# Patient Record
Sex: Male | Born: 1980 | Race: White | Hispanic: No | Marital: Single | Smoking: Current every day smoker
Health system: Southern US, Community
[De-identification: ages and names within clinical notes are randomized; demographics above are authoritative.]

---

## 2006-03-16 ENCOUNTER — Emergency Department (HOSPITAL_COMMUNITY): Admission: EM | Admit: 2006-03-16 | Discharge: 2006-03-16 | Payer: Self-pay | Admitting: Emergency Medicine

## 2014-05-24 ENCOUNTER — Emergency Department (HOSPITAL_COMMUNITY)
Admission: EM | Admit: 2014-05-24 | Discharge: 2014-05-24 | Disposition: A | Discharge status: Home / Self Care | Payer: Self-pay | Attending: Emergency Medicine | Admitting: Emergency Medicine

## 2014-05-24 ENCOUNTER — Encounter (HOSPITAL_COMMUNITY): Payer: Self-pay | Admitting: Emergency Medicine

## 2014-05-24 ENCOUNTER — Emergency Department (HOSPITAL_COMMUNITY): Payer: Self-pay

## 2014-05-24 DIAGNOSIS — Z720 Tobacco use: Secondary | ICD-10-CM | POA: Insufficient documentation

## 2014-05-24 DIAGNOSIS — N201 Calculus of ureter: Secondary | ICD-10-CM | POA: Insufficient documentation

## 2014-05-24 DIAGNOSIS — R109 Unspecified abdominal pain: Secondary | ICD-10-CM

## 2014-05-24 LAB — CBC WITH DIFFERENTIAL/PLATELET
BASOS ABS: 0.1 10*3/uL (ref 0.0–0.1)
Basophils Relative: 1 % (ref 0–1)
EOS PCT: 4 % (ref 0–5)
Eosinophils Absolute: 0.4 10*3/uL (ref 0.0–0.7)
HCT: 44.2 % (ref 39.0–52.0)
HEMOGLOBIN: 15.3 g/dL (ref 13.0–17.0)
LYMPHS ABS: 3 10*3/uL (ref 0.7–4.0)
Lymphocytes Relative: 31 % (ref 12–46)
MCH: 31.7 pg (ref 26.0–34.0)
MCHC: 34.6 g/dL (ref 30.0–36.0)
MCV: 91.7 fL (ref 78.0–100.0)
MONO ABS: 0.7 10*3/uL (ref 0.1–1.0)
Monocytes Relative: 7 % (ref 3–12)
Neutro Abs: 5.5 10*3/uL (ref 1.7–7.7)
Neutrophils Relative %: 57 % (ref 43–77)
Platelets: 213 10*3/uL (ref 150–400)
RBC: 4.82 MIL/uL (ref 4.22–5.81)
RDW: 12.2 % (ref 11.5–15.5)
WBC: 9.5 10*3/uL (ref 4.0–10.5)

## 2014-05-24 LAB — URINE MICROSCOPIC-ADD ON

## 2014-05-24 LAB — BASIC METABOLIC PANEL
ANION GAP: 14 (ref 5–15)
BUN: 19 mg/dL (ref 6–23)
CALCIUM: 9.3 mg/dL (ref 8.4–10.5)
CHLORIDE: 100 meq/L (ref 96–112)
CO2: 25 meq/L (ref 19–32)
CREATININE: 1.42 mg/dL — AB (ref 0.50–1.35)
GFR calc Af Amer: 74 mL/min — ABNORMAL LOW (ref >90)
GFR calc non Af Amer: 64 mL/min — ABNORMAL LOW (ref >90)
GLUCOSE: 101 mg/dL — AB (ref 70–99)
Potassium: 4.4 mEq/L (ref 3.7–5.3)
Sodium: 139 mEq/L (ref 137–147)

## 2014-05-24 LAB — URINALYSIS, ROUTINE W REFLEX MICROSCOPIC
Bilirubin Urine: NEGATIVE
Glucose, UA: NEGATIVE mg/dL
Ketones, ur: NEGATIVE mg/dL
Nitrite: NEGATIVE
PH: 6 (ref 5.0–8.0)
PROTEIN: NEGATIVE mg/dL
Specific Gravity, Urine: 1.012 (ref 1.005–1.030)
Urobilinogen, UA: 0.2 mg/dL (ref 0.0–1.0)

## 2014-05-24 MED ORDER — HYDROCODONE-ACETAMINOPHEN 5-325 MG PO TABS: 1.0000 | ORAL_TABLET | Freq: Four times a day (QID) | ORAL | Status: AC | PRN

## 2014-05-24 MED ORDER — PROMETHAZINE HCL 25 MG PO TABS: 25.0000 mg | ORAL_TABLET | Freq: Four times a day (QID) | ORAL | Status: DC | PRN

## 2014-05-24 MED ORDER — SODIUM CHLORIDE 0.9 % IV SOLN
INTRAVENOUS | Status: DC
  Administered 2014-05-24: 17:00:00 via INTRAVENOUS

## 2014-05-24 MED ORDER — IBUPROFEN 800 MG PO TABS: 800.0000 mg | ORAL_TABLET | Freq: Three times a day (TID) | ORAL | Status: DC

## 2014-05-24 MED ORDER — SODIUM CHLORIDE 0.9 % IV BOLUS (SEPSIS)
1000.0000 mL | Freq: Once | INTRAVENOUS | Status: AC
  Administered 2014-05-24: 1000 mL via INTRAVENOUS

## 2014-05-24 MED ORDER — HYDROMORPHONE HCL 1 MG/ML IJ SOLN
1.0000 mg | Freq: Once | INTRAMUSCULAR | Status: AC
  Administered 2014-05-24: 1 mg via INTRAVENOUS
  Filled 2014-05-24: qty 1

## 2014-05-24 MED ORDER — HYDROCODONE-ACETAMINOPHEN 5-325 MG PO TABS: 1.0000 | ORAL_TABLET | Freq: Four times a day (QID) | ORAL | Status: DC | PRN

## 2014-05-24 MED ORDER — ONDANSETRON HCL 4 MG/2ML IJ SOLN
4.0000 mg | Freq: Once | INTRAMUSCULAR | Status: AC
  Administered 2014-05-24: 4 mg via INTRAVENOUS
  Filled 2014-05-24: qty 2

## 2014-05-24 NOTE — ED Provider Notes (Signed)
CSN: [id number]     Arrival date & time 05/24/14  1636 History   First MD Initiated Contact with Patient 05/24/14 1656     Chief Complaint  Patient presents with  . Flank Pain     (Consider location/radiation/quality/duration/timing/severity/associated sxs/prior Treatment) Patient is a 33 y.o. male presenting with flank pain. The history is provided by the patient.  Flank Pain Associated symptoms include abdominal pain. Pertinent negatives include no chest pain, no headaches and no shortness of breath.   patient with onset of left flank pain on Friday. Pains been intermittent of late it's been more on the left lower quadrant part of the abdomen. Associated with nausea no vomiting. No history of kidney stones. Pain currently 8 out of 10. Sharp colicky in nature. Pain goes from the back area on the left to the left flank into the left lower quadrant. No testicular pain. Nothing makes it better or worse. Patient has been taking Motrin at home.  History reviewed. No pertinent past medical history. History reviewed. No pertinent past surgical history. No family history on file. History  Substance Use Topics  . Smoking status: Current Every Day Smoker  . Smokeless tobacco: Not on file  . Alcohol Use: Yes     Comment: rare    Review of Systems  Constitutional: Negative for fever.  HENT: Negative for congestion.   Eyes: Negative for visual disturbance.  Respiratory: Negative for shortness of breath.   Cardiovascular: Negative for chest pain.  Gastrointestinal: Positive for nausea and abdominal pain. Negative for vomiting.  Genitourinary: Positive for urgency, hematuria and flank pain. Negative for dysuria.  Musculoskeletal: Positive for back pain.  Skin: Negative for rash.  Neurological: Negative for headaches.  Hematological: Does not bruise/bleed easily.  Psychiatric/Behavioral: Negative for confusion.      Allergies  Review of patient's allergies indicates no known  allergies.  Home Medications   Prior to Admission medications   Medication Sig Start Date End Date Taking? Authorizing Provider  HYDROcodone-acetaminophen (NORCO/VICODIN) 5-325 MG per tablet Take 1-2 tablets by mouth every 6 (six) hours as needed for moderate pain. 05/24/14   Vanetta Mulders, MD  ibuprofen (ADVIL,MOTRIN) 800 MG tablet Take 1 tablet (800 mg total) by mouth 3 (three) times daily. 05/24/14   Vanetta Mulders, MD  promethazine (PHENERGAN) 25 MG tablet Take 1 tablet (25 mg total) by mouth every 6 (six) hours as needed for nausea or vomiting. 05/24/14   Vanetta Mulders, MD   BP 112/69 mmHg  Pulse 55  Temp(Src) 98.1 F (36.7 C) (Oral)  Resp 16  SpO2 97% Physical Exam  Constitutional: He is oriented to person, place, and time. He appears well-developed and well-nourished. No distress.  HENT:  Head: Normocephalic and atraumatic.  Eyes: Conjunctivae and EOM are normal. Pupils are equal, round, and reactive to light.  Neck: Normal range of motion.  Cardiovascular: Normal rate, regular rhythm and normal heart sounds.   No murmur heard. Pulmonary/Chest: Effort normal and breath sounds normal. No respiratory distress.  Abdominal: Soft. Bowel sounds are normal.  Musculoskeletal: Normal range of motion.  Neurological: He is alert and oriented to person, place, and time. No cranial nerve deficit. He exhibits normal muscle tone. Coordination normal.  Skin: Skin is warm. No rash noted.  Nursing note and vitals reviewed.   ED Course  Procedures (including critical care time) Labs Review Labs Reviewed  URINALYSIS, ROUTINE W REFLEX MICROSCOPIC - Abnormal; Notable for the following:    APPearance HAZY (*)    Hgb  urine dipstick LARGE (*)    Leukocytes, UA TRACE (*)    All other components within normal limits  BASIC METABOLIC PANEL - Abnormal; Notable for the following:    Glucose, Bld 101 (*)    Creatinine, Ser 1.42 (*)    GFR calc non Af Amer 64 (*)    GFR calc Af Amer 74 (*)     All other components within normal limits  URINE MICROSCOPIC-ADD ON - Abnormal; Notable for the following:    Squamous Epithelial / LPF FEW (*)    Bacteria, UA FEW (*)    Casts HYALINE CASTS (*)    All other components within normal limits  CBC WITH DIFFERENTIAL   Results for orders placed or performed during the hospital encounter of 05/24/14  Urinalysis, Routine w reflex microscopic  Result Value Ref Range   Color, Urine YELLOW YELLOW   APPearance HAZY (A) CLEAR   Specific Gravity, Urine 1.012 1.005 - 1.030   pH 6.0 5.0 - 8.0   Glucose, UA NEGATIVE NEGATIVE mg/dL   Hgb urine dipstick LARGE (A) NEGATIVE   Bilirubin Urine NEGATIVE NEGATIVE   Ketones, ur NEGATIVE NEGATIVE mg/dL   Protein, ur NEGATIVE NEGATIVE mg/dL   Urobilinogen, UA 0.2 0.0 - 1.0 mg/dL   Nitrite NEGATIVE NEGATIVE   Leukocytes, UA TRACE (A) NEGATIVE  CBC with Differential  Result Value Ref Range   WBC 9.5 4.0 - 10.5 K/uL   RBC 4.82 4.22 - 5.81 MIL/uL   Hemoglobin 15.3 13.0 - 17.0 g/dL   HCT 69.644.2 29.539.0 - 28.452.0 %   MCV 91.7 78.0 - 100.0 fL   MCH 31.7 26.0 - 34.0 pg   MCHC 34.6 30.0 - 36.0 g/dL   RDW 13.212.2 44.011.5 - 10.215.5 %   Platelets 213 150 - 400 K/uL   Neutrophils Relative % 57 43 - 77 %   Neutro Abs 5.5 1.7 - 7.7 K/uL   Lymphocytes Relative 31 12 - 46 %   Lymphs Abs 3.0 0.7 - 4.0 K/uL   Monocytes Relative 7 3 - 12 %   Monocytes Absolute 0.7 0.1 - 1.0 K/uL   Eosinophils Relative 4 0 - 5 %   Eosinophils Absolute 0.4 0.0 - 0.7 K/uL   Basophils Relative 1 0 - 1 %   Basophils Absolute 0.1 0.0 - 0.1 K/uL  Basic metabolic panel  Result Value Ref Range   Sodium 139 137 - 147 mEq/L   Potassium 4.4 3.7 - 5.3 mEq/L   Chloride 100 96 - 112 mEq/L   CO2 25 19 - 32 mEq/L   Glucose, Bld 101 (H) 70 - 99 mg/dL   BUN 19 6 - 23 mg/dL   Creatinine, Ser 7.251.42 (H) 0.50 - 1.35 mg/dL   Calcium 9.3 8.4 - 36.610.5 mg/dL   GFR calc non Af Amer 64 (L) >90 mL/min   GFR calc Af Amer 74 (L) >90 mL/min   Anion gap 14 5 - 15  Urine  microscopic-add on  Result Value Ref Range   Squamous Epithelial / LPF FEW (A) RARE   WBC, UA 0-2 <3 WBC/hpf   RBC / HPF TOO NUMEROUS TO COUNT <3 RBC/hpf   Bacteria, UA FEW (A) RARE   Casts HYALINE CASTS (A) NEGATIVE   Urine-Other MUCOUS PRESENT    As a result of his leg in the old Imaging Review Ct Abdomen Pelvis Wo Contrast  05/24/2014   CLINICAL DATA:  Left flank and left lower quadrant pain.  EXAM: CT ABDOMEN AND  PELVIS WITHOUT CONTRAST  TECHNIQUE: Multidetector CT imaging of the abdomen and pelvis was performed following the standard protocol without IV contrast.  COMPARISON:  None.  FINDINGS: The lung bases are clear.  No pleural or pericardial effusion.  There is mild left hydronephrosis with stranding about the left kidney and ureter due to a 0.3 cm stone at the left UVJ. No other left urinary tract stones are identified. Two punctate nonobstructing stones are seen in the lower pole of the right kidney.  The gallbladder, liver, spleen, adrenal glands, pancreas and biliary tree appear normal. The stomach, small and large bowel and appendix are unremarkable. There is no lymphadenopathy or fluid. Duplicated inferior vena cava is noted. No focal bony abnormality is identified.  IMPRESSION: Mild left hydronephrosis due to a 0.3 cm stone at the left UVJ.  Two punctate nonobstructing stones lower pole right kidney.  Duplicated inferior vena cava.   Electronically Signed   By: Drusilla Kannerhomas  Dalessio M.D.   On: 05/24/2014 20:19     EKG Interpretation None      MDM   Final diagnoses:  Flank pain  Left ureteral stone    Patient with left flank pain urinalysis symptoms consistent with kidney stone. Kidney stone confirmed by CT scan showing a distal ureter 3 mm stone on the left side. Patient with some mild renal insufficiency no fevers not toxic no evidence of any infection. Will be treated symptomatically with follow-up with urology and precautions.    Vanetta MuldersScott Marilea Gwynne, MD 05/24/14 2127

## 2014-05-24 NOTE — Discharge Instructions (Signed)
Call urology for follow-up. Take the medications as prescribed. Would expect you to pass the stone in the next 24 hours. If not return here or follow-up with urology. Also return here for any new or worse symptoms.

## 2014-05-24 NOTE — ED Notes (Signed)
Pt reports left sided flank pain for several days, today being the worse. Reports mild burning with urination.

## 2014-05-24 NOTE — ED Notes (Signed)
Pt c/o pain in left flank radiating around to front.  St's he was seen by his MD today and was told he did not have UTI but did have lots of blood in his urine.

## 2014-10-17 ENCOUNTER — Telehealth: Payer: Self-pay

## 2014-10-19 ENCOUNTER — Ambulatory Visit: Payer: BLUE CROSS/BLUE SHIELD | Admitting: Physical Therapy

## 2014-10-19 NOTE — Telephone Encounter (Signed)
patient cancelled eval due to deductible $500 and coinsurance 50%   By Lenell AntuSarah M Izamar Linden

## 2016-02-15 IMAGING — CT CT ABD-PELV W/O CM
2 of 4 series · 16 of 46 positions shown, 18 images · non-contrast
Comparison: None.

CLINICAL DATA: Left flank and left lower quadrant pain.

EXAM:
CT ABDOMEN AND PELVIS WITHOUT CONTRAST
TECHNIQUE: Multidetector CT imaging of the abdomen and pelvis was performed
following the standard protocol without IV contrast.

[Series 2: abd/ pelvis 5.0 i30f 1 · axial · 0.78mm/px · z∈[-267,+198]mm · 13 of 101 slices shown, 15 images]
[im 4/101  soft-tissue]
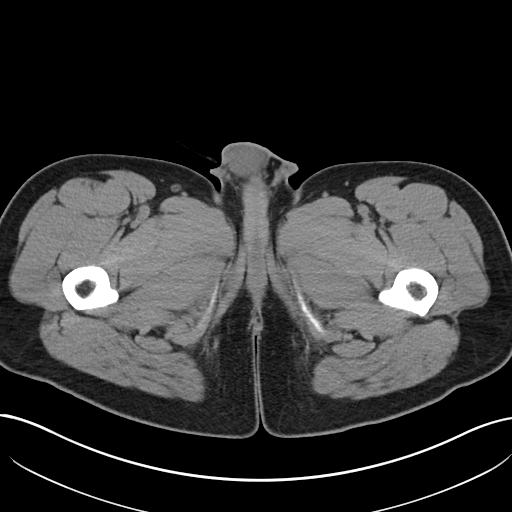
[im 4/101  bone]
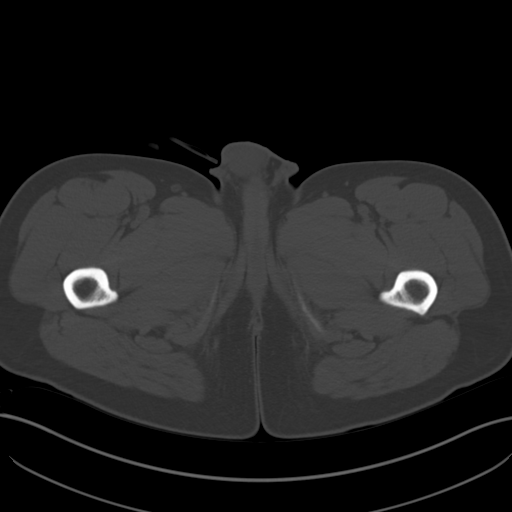
[im 12/101  soft-tissue]
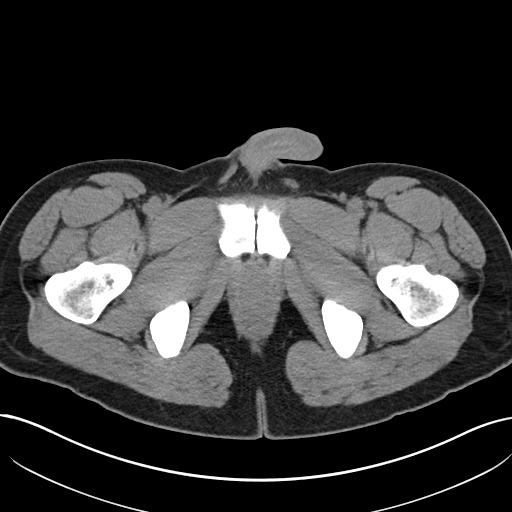
[im 20/101  soft-tissue]
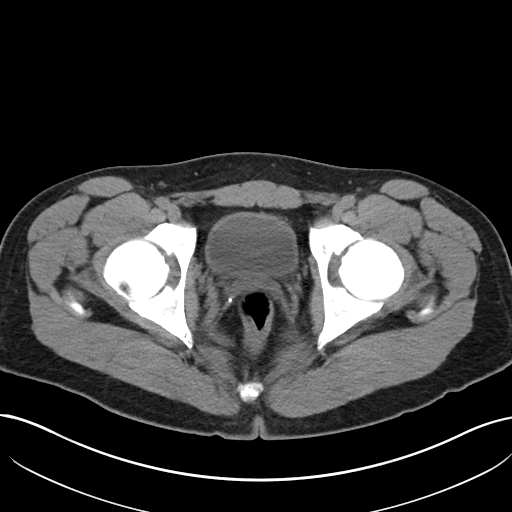
[im 27/101  soft-tissue]
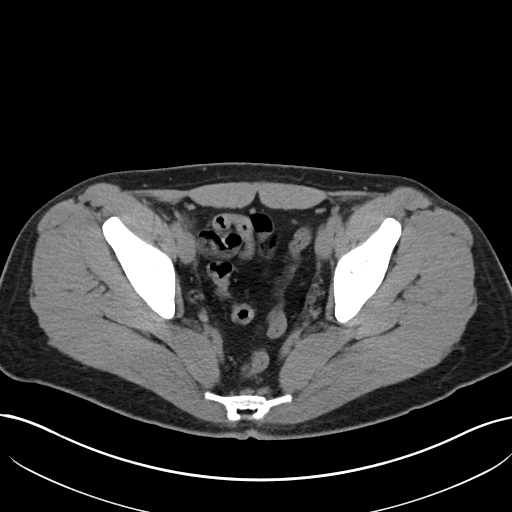
[im 35/101  soft-tissue]
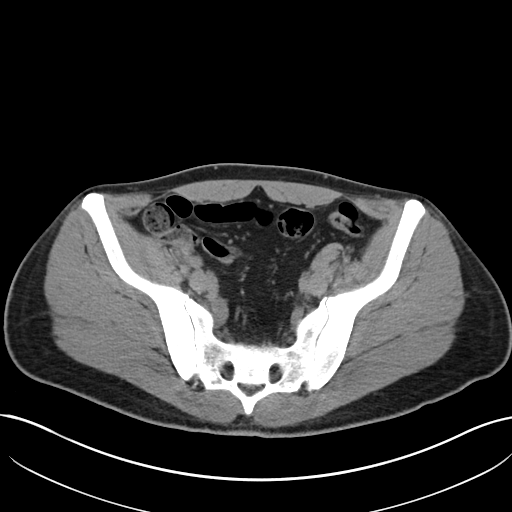
[im 43/101  soft-tissue]
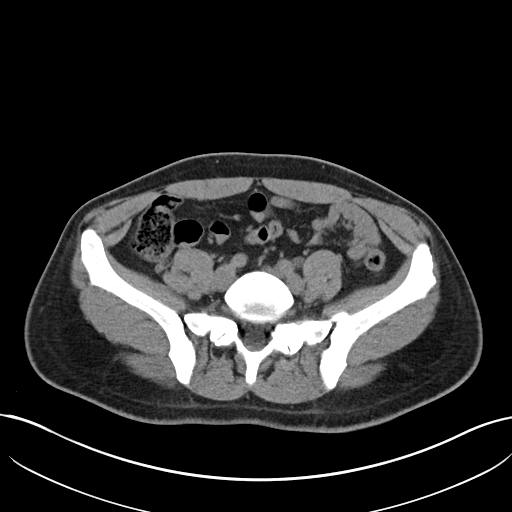
[im 51/101  soft-tissue]
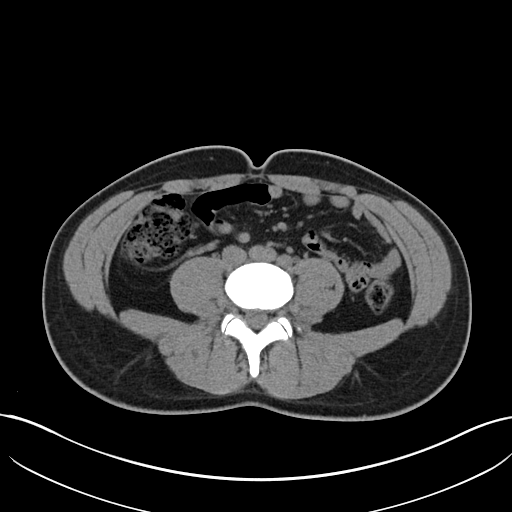
[im 58/101  soft-tissue]
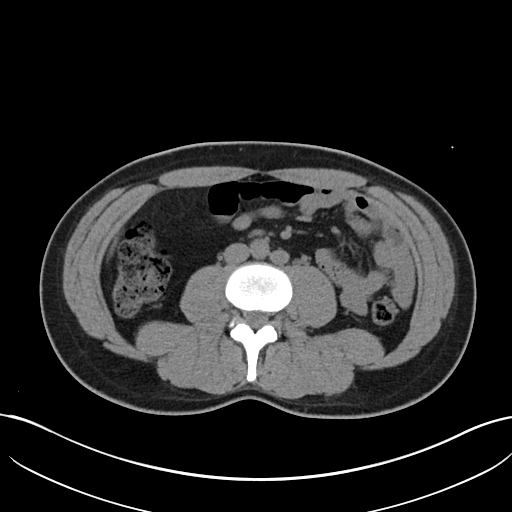
[im 66/101  soft-tissue]
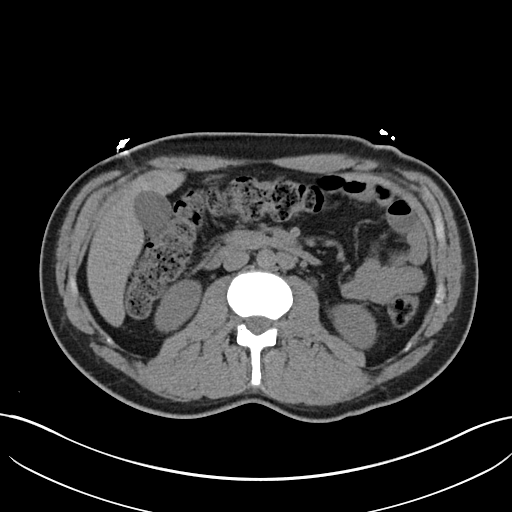
[im 66/101  bone]
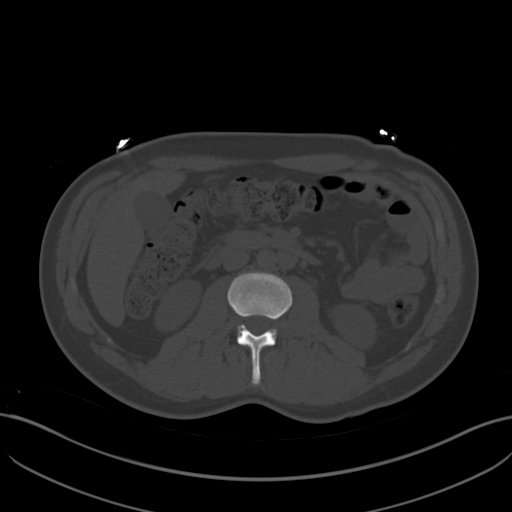
[im 74/101  soft-tissue]
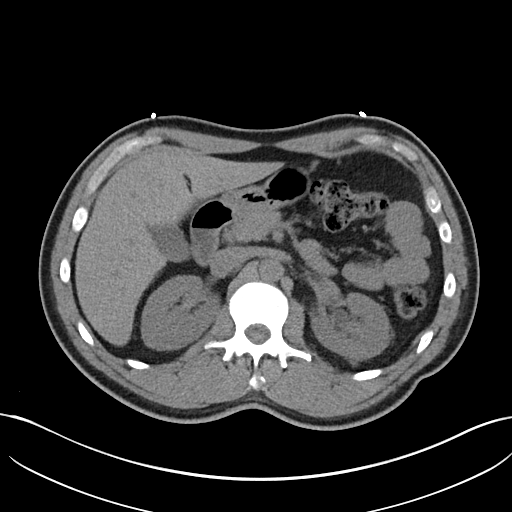
[im 81/101  soft-tissue]
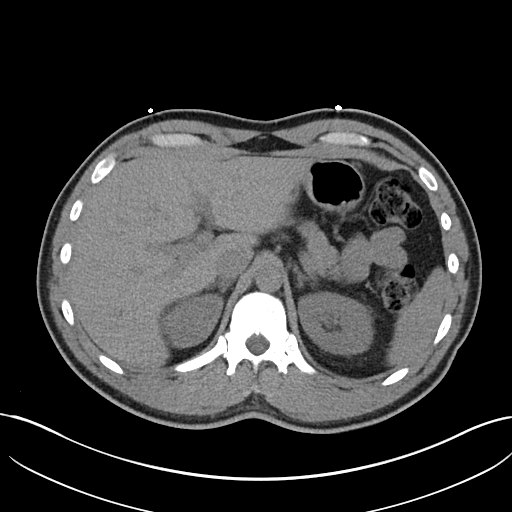
[im 89/101  soft-tissue]
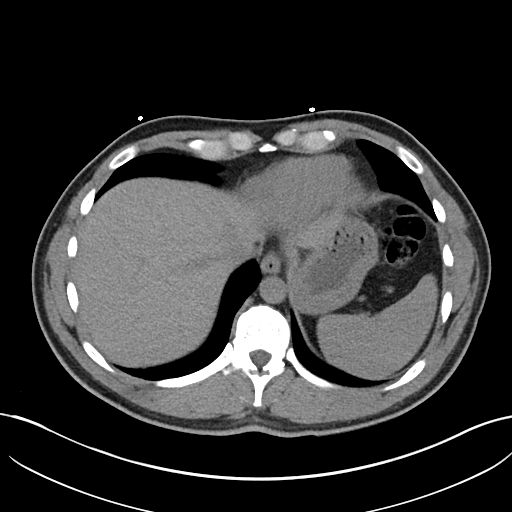
[im 97/101  soft-tissue]
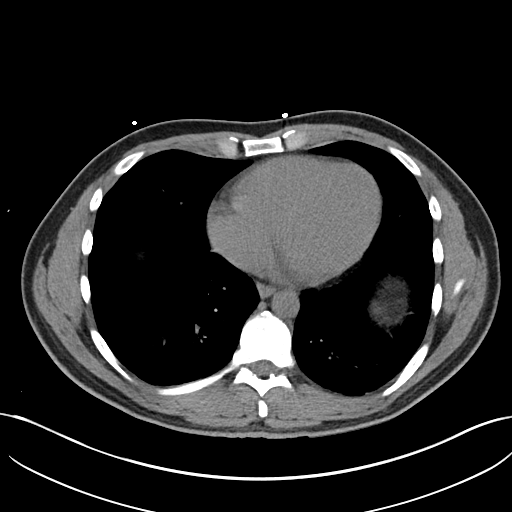

[Series 5: cor st · coronal · 0.94mm/px · 3 of 76 slices shown]
[im 26/76  soft-tissue]
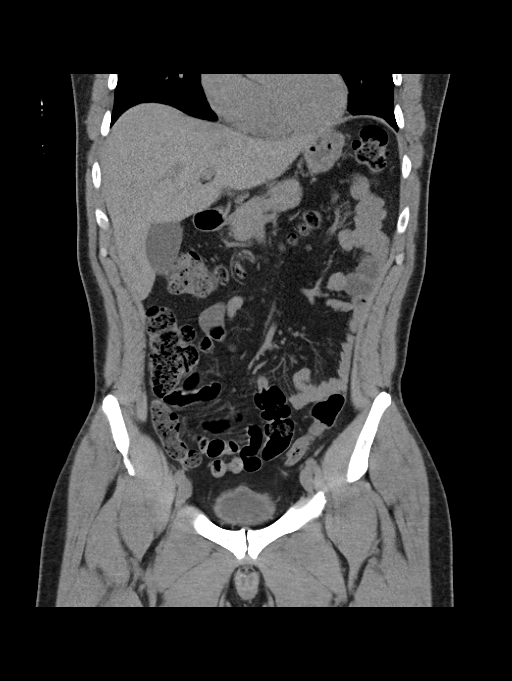
[im 34/76  soft-tissue]
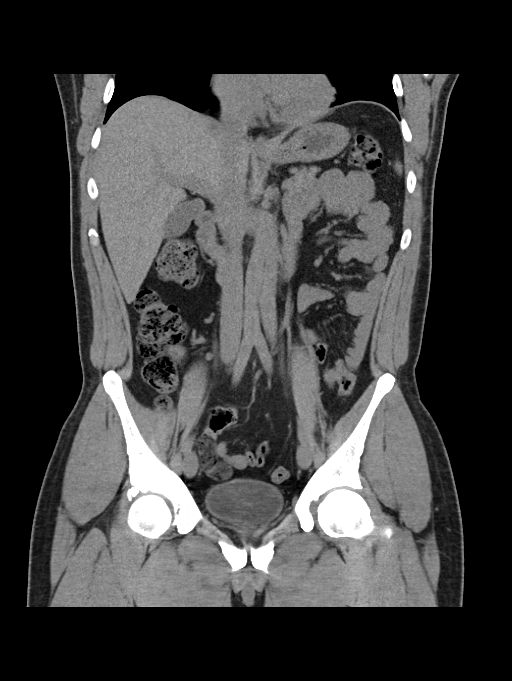
[im 42/76  soft-tissue]
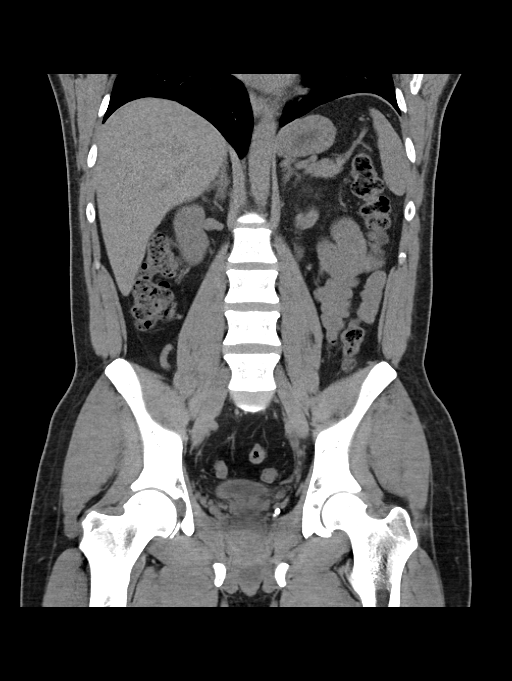

[16 of 46 positions shown; findings below may reference images not displayed]

FINDINGS: The lung bases are clear.  No pleural or pericardial effusion.

There is mild left hydronephrosis with stranding about the left
kidney and ureter due to a 0.3 cm stone at the left UVJ. No other
left urinary tract stones are identified. Two punctate
nonobstructing stones are seen in the lower pole of the right
kidney.

The gallbladder, liver, spleen, adrenal glands, pancreas and biliary
tree appear normal. The stomach, small and large bowel and appendix
are unremarkable. There is no lymphadenopathy or fluid. Duplicated
inferior vena cava is noted. No focal bony abnormality is
identified.
IMPRESSION: Mild left hydronephrosis due to a 0.3 cm stone at the left UVJ.

Two punctate nonobstructing stones lower pole right kidney.

Duplicated inferior vena cava.

## 2016-03-21 ENCOUNTER — Emergency Department (HOSPITAL_COMMUNITY): Payer: BLUE CROSS/BLUE SHIELD

## 2016-03-21 ENCOUNTER — Encounter (HOSPITAL_COMMUNITY): Payer: Self-pay | Admitting: Emergency Medicine

## 2016-03-21 ENCOUNTER — Emergency Department (HOSPITAL_COMMUNITY)
Admission: EM | Admit: 2016-03-21 | Discharge: 2016-03-21 | Disposition: A | Discharge status: Home / Self Care | Payer: BLUE CROSS/BLUE SHIELD | Attending: Emergency Medicine | Admitting: Emergency Medicine

## 2016-03-21 DIAGNOSIS — N201 Calculus of ureter: Secondary | ICD-10-CM

## 2016-03-21 DIAGNOSIS — N202 Calculus of kidney with calculus of ureter: Secondary | ICD-10-CM | POA: Diagnosis not present

## 2016-03-21 DIAGNOSIS — F17210 Nicotine dependence, cigarettes, uncomplicated: Secondary | ICD-10-CM | POA: Diagnosis not present

## 2016-03-21 DIAGNOSIS — R1031 Right lower quadrant pain: Secondary | ICD-10-CM | POA: Diagnosis present

## 2016-03-21 LAB — URINALYSIS, ROUTINE W REFLEX MICROSCOPIC
Glucose, UA: NEGATIVE mg/dL
KETONES UR: 15 mg/dL — AB
LEUKOCYTES UA: NEGATIVE
NITRITE: NEGATIVE
PH: 6 (ref 5.0–8.0)
Protein, ur: 30 mg/dL — AB
SPECIFIC GRAVITY, URINE: 1.036 — AB (ref 1.005–1.030)

## 2016-03-21 LAB — LIPASE, BLOOD: Lipase: 25 U/L (ref 11–51)

## 2016-03-21 LAB — CBC WITH DIFFERENTIAL/PLATELET
Basophils Absolute: 0 10*3/uL (ref 0.0–0.1)
Basophils Relative: 0 %
Eosinophils Absolute: 0.2 10*3/uL (ref 0.0–0.7)
Eosinophils Relative: 2 %
HEMATOCRIT: 44.6 % (ref 39.0–52.0)
Hemoglobin: 14.7 g/dL (ref 13.0–17.0)
LYMPHS ABS: 3.5 10*3/uL (ref 0.7–4.0)
Lymphocytes Relative: 38 %
MCH: 30.8 pg (ref 26.0–34.0)
MCHC: 33 g/dL (ref 30.0–36.0)
MCV: 93.5 fL (ref 78.0–100.0)
MONO ABS: 0.6 10*3/uL (ref 0.1–1.0)
Monocytes Relative: 6 %
NEUTROS ABS: 4.9 10*3/uL (ref 1.7–7.7)
Neutrophils Relative %: 54 %
Platelets: 205 10*3/uL (ref 150–400)
RBC: 4.77 MIL/uL (ref 4.22–5.81)
RDW: 12.4 % (ref 11.5–15.5)
WBC: 9.3 10*3/uL (ref 4.0–10.5)

## 2016-03-21 LAB — COMPREHENSIVE METABOLIC PANEL
ALBUMIN: 4.2 g/dL (ref 3.5–5.0)
ALT: 16 U/L — ABNORMAL LOW (ref 17–63)
AST: 13 U/L — AB (ref 15–41)
Alkaline Phosphatase: 49 U/L (ref 38–126)
Anion gap: 7 (ref 5–15)
BILIRUBIN TOTAL: 1.4 mg/dL — AB (ref 0.3–1.2)
BUN: 14 mg/dL (ref 6–20)
CO2: 25 mmol/L (ref 22–32)
Calcium: 9.8 mg/dL (ref 8.9–10.3)
Chloride: 107 mmol/L (ref 101–111)
Creatinine, Ser: 1.18 mg/dL (ref 0.61–1.24)
GFR calc Af Amer: >60 mL/min (ref >60)
GFR calc non Af Amer: >60 mL/min (ref >60)
Glucose, Bld: 106 mg/dL — ABNORMAL HIGH (ref 65–99)
Potassium: 3.9 mmol/L (ref 3.5–5.1)
Sodium: 139 mmol/L (ref 135–145)
TOTAL PROTEIN: 6.2 g/dL — AB (ref 6.5–8.1)

## 2016-03-21 LAB — URINE MICROSCOPIC-ADD ON: Squamous Epithelial / LPF: NONE SEEN

## 2016-03-21 MED ORDER — ONDANSETRON HCL 4 MG/2ML IJ SOLN
4.0000 mg | Freq: Once | INTRAMUSCULAR | Status: AC
  Administered 2016-03-21: 4 mg via INTRAVENOUS
  Filled 2016-03-21: qty 2

## 2016-03-21 MED ORDER — OXYCODONE-ACETAMINOPHEN 5-325 MG PO TABS: 1.0000 | ORAL_TABLET | ORAL | 0 refills | Status: AC | PRN

## 2016-03-21 MED ORDER — HYDROMORPHONE HCL 1 MG/ML IJ SOLN: 1.0000 mg | INTRAMUSCULAR | Status: DC | PRN

## 2016-03-21 MED ORDER — TAMSULOSIN HCL 0.4 MG PO CAPS: 0.4000 mg | ORAL_CAPSULE | Freq: Every day | ORAL | 0 refills | Status: AC

## 2016-03-21 MED ORDER — KETOROLAC TROMETHAMINE 30 MG/ML IJ SOLN
30.0000 mg | Freq: Once | INTRAMUSCULAR | Status: AC
  Administered 2016-03-21: 30 mg via INTRAVENOUS
  Filled 2016-03-21: qty 1

## 2016-03-21 MED ORDER — PROMETHAZINE HCL 25 MG PO TABS: 25.0000 mg | ORAL_TABLET | Freq: Four times a day (QID) | ORAL | 0 refills | Status: AC | PRN

## 2016-03-21 MED ORDER — HYDROMORPHONE HCL 1 MG/ML IJ SOLN
1.0000 mg | Freq: Once | INTRAMUSCULAR | Status: AC
  Administered 2016-03-21: 1 mg via INTRAVENOUS
  Filled 2016-03-21: qty 1

## 2016-03-21 NOTE — ED Provider Notes (Signed)
[facility]-EMERGENCY DEPT Provider Note   CSN: [id number] Arrival date & time: 03/21/16  0808     History   Chief Complaint Chief Complaint  Patient presents with  . Flank Pain    HPI Khush Pasion is a 35 y.o. male.  HPI   Patient presents with right flank pain that feels like prior kidney stone.  States the pain is in his RLQ abdomen and right lower back.  It began suddenly around 1.5 hours prior to arrival.  Has had some discoloration of his urine and burning with urination.  Has kidney stone in 2015 that he pass spontaneously, did follow up with urologist, unsure of name.  Denies fevers, vomiting, testicular swelling or pain.  Has had loose stools over the past few days.  Denies prior abdominal surgeries.   Denies abdominal or back trauma.    History reviewed. No pertinent past medical history.  There are no active problems to display for this patient.   History reviewed. No pertinent surgical history.     Home Medications    Prior to Admission medications   Medication Sig Start Date End Date Taking? Authorizing Provider  ALPRAZolam Prudy Feeler) 0.25 MG tablet Take 0.25 mg by mouth daily as needed for anxiety. 12/22/15  Yes Historical Provider, MD  HYDROcodone-acetaminophen (NORCO/VICODIN) 5-325 MG per tablet Take 1-2 tablets by mouth every 6 (six) hours as needed for moderate pain. 05/24/14  Yes Vanetta Mulders, MD  NON FORMULARY Take 1 Can by mouth at bedtime. Tea with magnesium citrate(powder form)   Yes Historical Provider, MD  omeprazole (PRILOSEC) 20 MG capsule Take 20 mg by mouth daily as needed.   Yes Historical Provider, MD  oxyCODONE-acetaminophen (PERCOCET/ROXICET) 5-325 MG tablet Take 1-2 tablets by mouth every 4 (four) hours as needed for severe pain. 03/21/16   Trixie Dredge, PA-C  promethazine (PHENERGAN) 25 MG tablet Take 1 tablet (25 mg total) by mouth every 6 (six) hours as needed for nausea. 03/21/16   Trixie Dredge, PA-C  tamsulosin (FLOMAX) 0.4 MG CAPS capsule Take 1  capsule (0.4 mg total) by mouth daily. 03/21/16   Trixie Dredge, PA-C    Family History History reviewed. No pertinent family history.  Social History Social History  Substance Use Topics  . Smoking status: Current Every Day Smoker    Packs/day: 0.50    Types: Cigarettes  . Smokeless tobacco: Not on file  . Alcohol use Yes     Comment: rare     Allergies   Review of patient's allergies indicates no known allergies.   Review of Systems Review of Systems  All other systems reviewed and are negative.    Physical Exam Updated Vital Signs BP 105/68 (BP Location: Left Arm)   Pulse (!) 57   Temp 97.9 F (36.6 C) (Oral)   Resp 18   SpO2 96%   Physical Exam  Constitutional: He appears well-developed and well-nourished.  HENT:  Head: Normocephalic and atraumatic.  Neck: Neck supple.  Pulmonary/Chest: Effort normal.  Abdominal: Soft. He exhibits no distension and no mass. There is tenderness. There is no rebound and no guarding.  Tenderness throughout right abdomen.  +Right CVA tenderness.    Neurological: He is alert.  Nursing note and vitals reviewed.    ED Treatments / Results  Labs (all labs ordered are listed, but only abnormal results are displayed) Labs Reviewed  URINALYSIS, ROUTINE W REFLEX MICROSCOPIC (NOT AT [facility]) - Abnormal; Notable for the following:       Result Value  Color, Urine AMBER (*)    APPearance CLOUDY (*)    Specific Gravity, Urine 1.036 (*)    Hgb urine dipstick LARGE (*)    Bilirubin Urine SMALL (*)    Ketones, ur 15 (*)    Protein, ur 30 (*)    All other components within normal limits  COMPREHENSIVE METABOLIC PANEL - Abnormal; Notable for the following:    Glucose, Bld 106 (*)    Total Protein 6.2 (*)    AST 13 (*)    ALT 16 (*)    Total Bilirubin 1.4 (*)    All other components within normal limits  URINE MICROSCOPIC-ADD ON - Abnormal; Notable for the following:    Bacteria, UA RARE (*)    All other components within normal  limits  URINE CULTURE  LIPASE, BLOOD  CBC WITH DIFFERENTIAL/PLATELET    EKG  EKG Interpretation None       Radiology Ct Renal Stone Study  Result Date: 03/21/2016 CLINICAL DATA:  35 year old with right flank pain and right lower quadrant abdominal pain. EXAM: CT ABDOMEN AND PELVIS WITHOUT CONTRAST TECHNIQUE: Multidetector CT imaging of the abdomen and pelvis was performed following the standard protocol without IV contrast. COMPARISON:  05/24/2014 FINDINGS: Lower chest: Lung bases are clear.  No pleural effusions. Hepatobiliary: Normal appearance of the gallbladder. No biliary dilatation. Normal appearance of the liver. Pancreas: Normal appearance of the pancreas without inflammation or duct dilatation. Spleen: Normal appearance of spleen without enlargement. Adrenals/Urinary Tract: Normal adrenal glands. Normal appearance the left kidney without stones or hydronephrosis. 3 mm stone in the bladder and probably at the right ureterovesical junction with mild fullness of the right ureter. Slightly decreased attenuation in the right kidney compared to the left. This decreased attenuation in right kidney is suggestive for edema. 2 mm stone in the right kidney lower pole. Stomach/Bowel: Stomach is within normal limits. Appendix appears normal. No evidence of bowel wall thickening, distention, or inflammatory changes. Vascular/Lymphatic: Duplicated inferior vena cava. The left IVC drains into the left renal vein which is the expected configuration. Otherwise, the vascular structures are unremarkable. No suspicious lymphadenopathy in the abdomen or pelvis. Reproductive: Few calcifications in the prostate. Normal appearance of the seminal vesicles. Other: No free fluid.  No free air. Musculoskeletal: No acute bone abnormality. IMPRESSION: Right kidney is mildly edematous with minimal hydroureteronephrosis. Findings are secondary to a 3 mm stone along the posterior bladder and probably within the right  ureterovesical junction. Small right renal calculus. Duplicated IVC. Electronically Signed   By: Richarda OverlieAdam  Henn M.D.   On: 03/21/2016 09:13    Procedures Procedures (including critical care time)  Medications Ordered in ED Medications  HYDROmorphone (DILAUDID) injection 1 mg (not administered)  HYDROmorphone (DILAUDID) injection 1 mg (1 mg Intravenous Given 03/21/16 0840)  ondansetron (ZOFRAN) injection 4 mg (4 mg Intravenous Given 03/21/16 0841)  HYDROmorphone (DILAUDID) injection 1 mg (1 mg Intravenous Given 03/21/16 0917)  ketorolac (TORADOL) 30 MG/ML injection 30 mg (30 mg Intravenous Given 03/21/16 1012)     Initial Impression / Assessment and Plan / ED Course  I have reviewed the triage vital signs and the nursing notes.  Pertinent labs & imaging results that were available during my care of the patient were reviewed by me and considered in my medical decision making (see chart for details).  Clinical Course   Afebrile nontoxic patient with acute right flank pain and urinary symptoms.  Labs unremarkable. UA does not appear infected.   CT renal study  demonstrates 3mm stone on right, UVJ, some hydro.  IV pain medication, nausea medication given with relief. D/C home with urology follow up.  Discussed result, findings, treatment, and follow up  with patient.  Pt given return precautions.  Pt verbalizes understanding and agrees with plan.      Final Clinical Impressions(s) / ED Diagnoses   Final diagnoses:  Right ureteral stone    New Prescriptions New Prescriptions   OXYCODONE-ACETAMINOPHEN (PERCOCET/ROXICET) 5-325 MG TABLET    Take 1-2 tablets by mouth every 4 (four) hours as needed for severe pain.   PROMETHAZINE (PHENERGAN) 25 MG TABLET    Take 1 tablet (25 mg total) by mouth every 6 (six) hours as needed for nausea.   TAMSULOSIN (FLOMAX) 0.4 MG CAPS CAPSULE    Take 1 capsule (0.4 mg total) by mouth daily.     Trixie Dredge, PA-C 03/21/16 [address] [place],  [place] 03/22/16 [phone]

## 2016-03-21 NOTE — ED Notes (Signed)
Given urine strainer to take home and education provided.

## 2016-03-21 NOTE — ED Notes (Signed)
Informed pt we needed urine sample, pt unable to go at this time.

## 2016-03-21 NOTE — Discharge Instructions (Signed)
Read the information below.  Use the prescribed medication as directed.  Please discuss all new medications with your pharmacist.  Do not take additional tylenol while taking the prescribed pain medication to avoid overdose.  You may return to the Emergency Department at any time for worsening condition or any new symptoms that concern you.    If you develop high fevers, worsening abdominal pain, uncontrolled vomiting, or are unable to tolerate fluids by mouth, return to the ER for a recheck.   °

## 2016-03-21 NOTE — ED Notes (Signed)
Patient taken to CT scan.

## 2016-03-21 NOTE — ED Triage Notes (Signed)
Pt arrives via POV from home with sudden onset of sharp right flank pain last night. Per pt has hx of kidney stones. Appears distressed due to pain. VSS.

## 2016-03-22 LAB — URINE CULTURE
CULTURE: NO GROWTH
SPECIAL REQUESTS: NORMAL

## 2017-12-13 IMAGING — CT CT RENAL STONE PROTOCOL
2 of 4 series · 16 of 46 positions shown, 18 images · non-contrast
Comparison: 05/24/2014

CLINICAL DATA: 35-year-old with right flank pain and right lower
quadrant abdominal pain.

EXAM:
CT ABDOMEN AND PELVIS WITHOUT CONTRAST
TECHNIQUE: Multidetector CT imaging of the abdomen and pelvis was performed
following the standard protocol without IV contrast.

[Series 2: renal stone 5mm · axial · 0.80mm/px · z∈[-599,-164]mm · 13 of 95 slices shown, 15 images]
[im 4/95  soft-tissue]
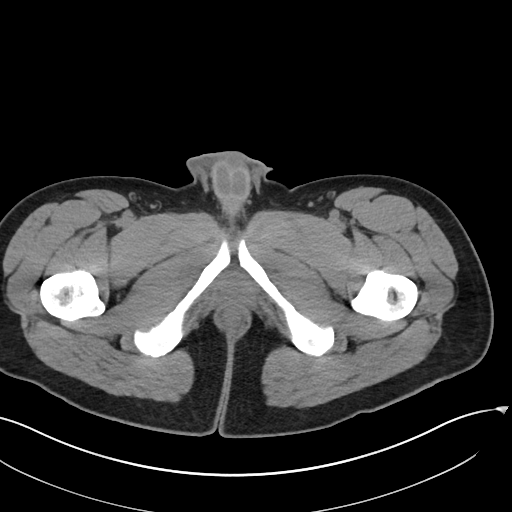
[im 4/95  bone]
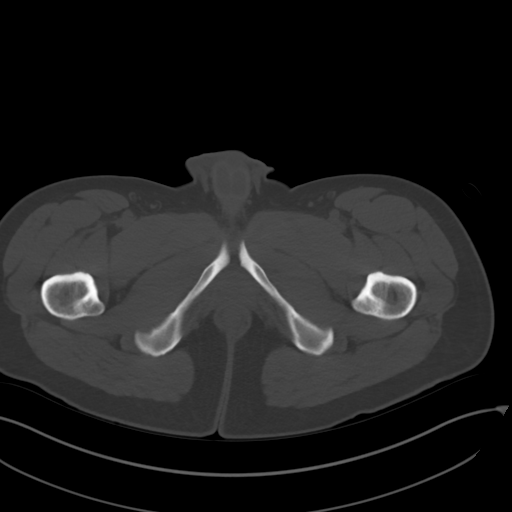
[im 12/95  soft-tissue]
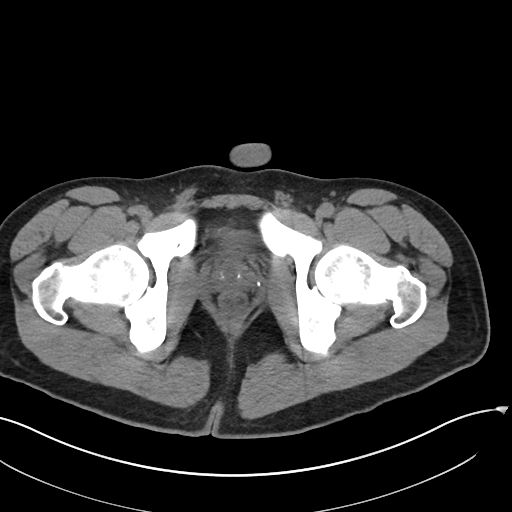
[im 20/95  soft-tissue]
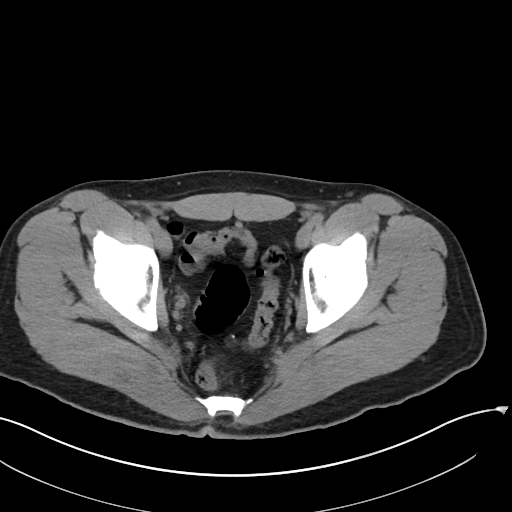
[im 28/95  soft-tissue]
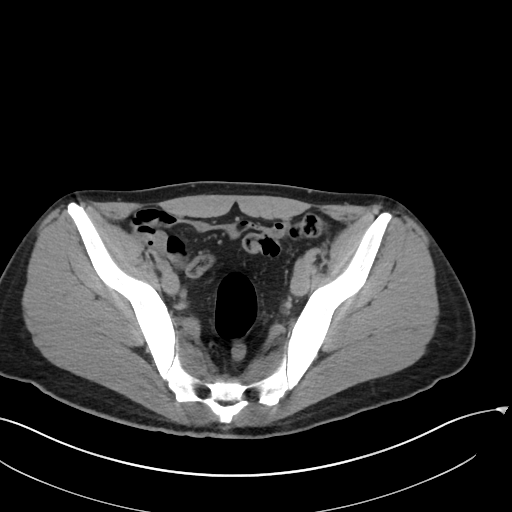
[im 32/95  soft-tissue]
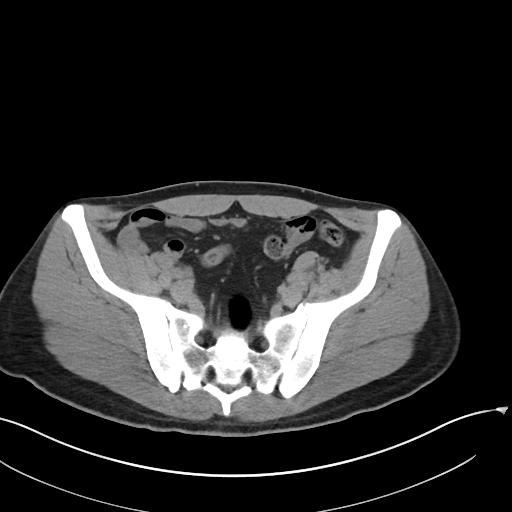
[im 40/95  soft-tissue]
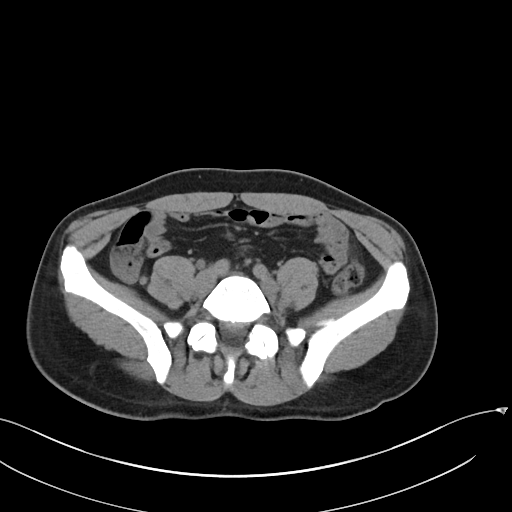
[im 48/95  soft-tissue]
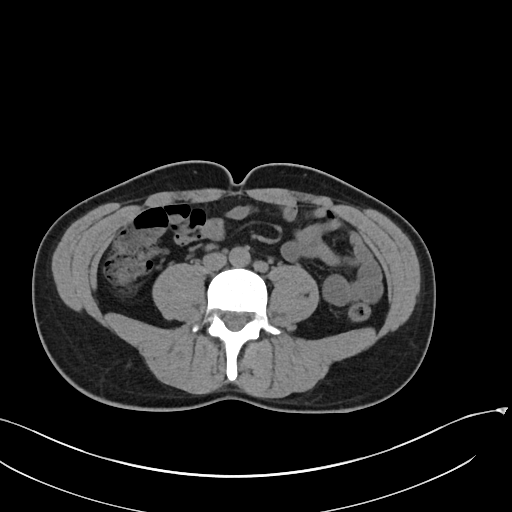
[im 55/95  soft-tissue]
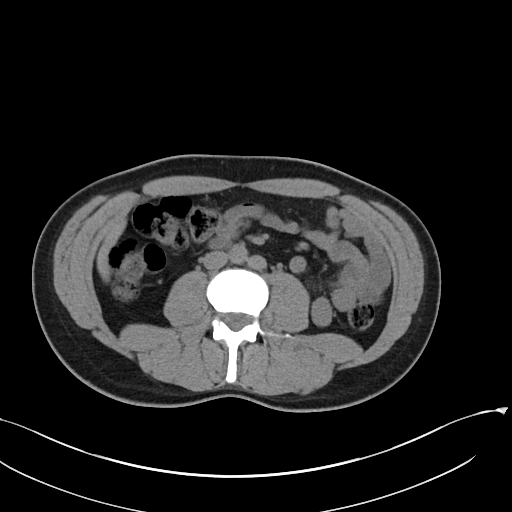
[im 63/95  soft-tissue]
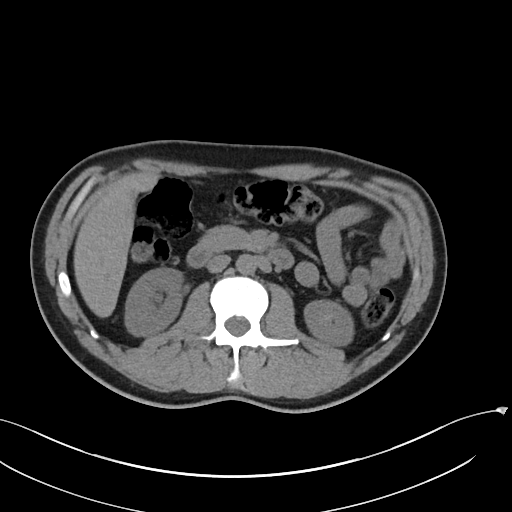
[im 63/95  bone]
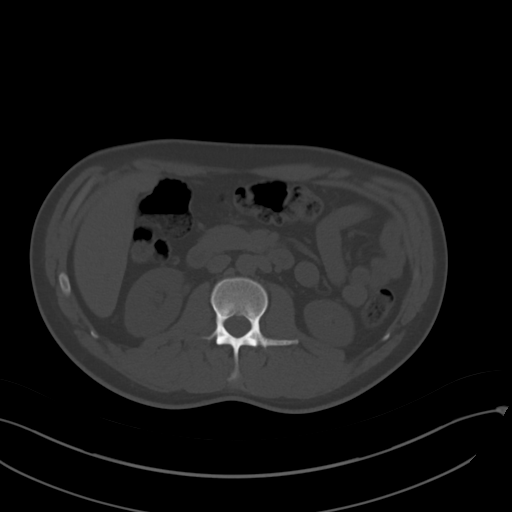
[im 67/95  soft-tissue]
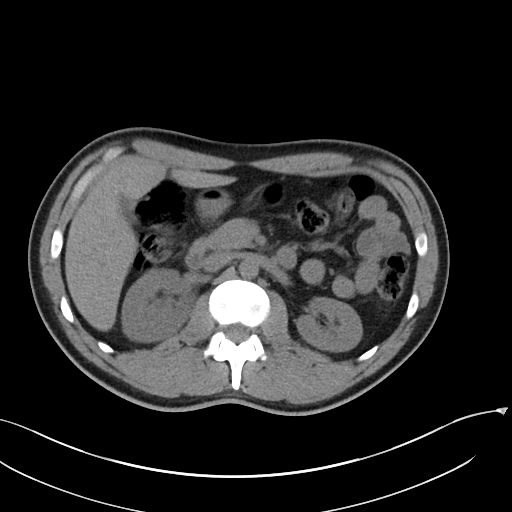
[im 75/95  soft-tissue]
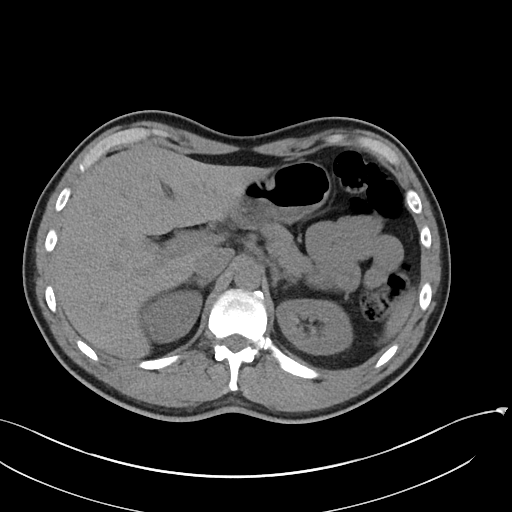
[im 83/95  soft-tissue]
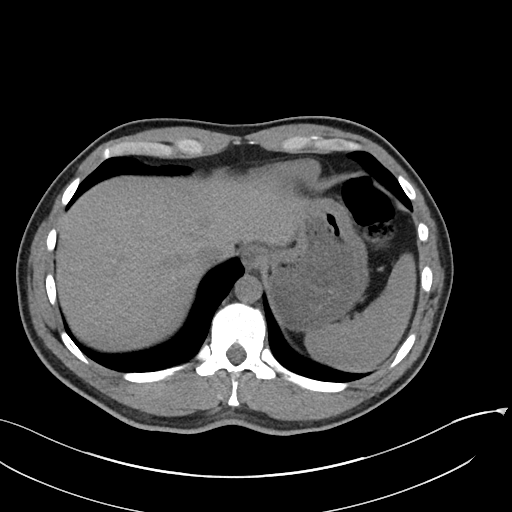
[im 91/95  soft-tissue]
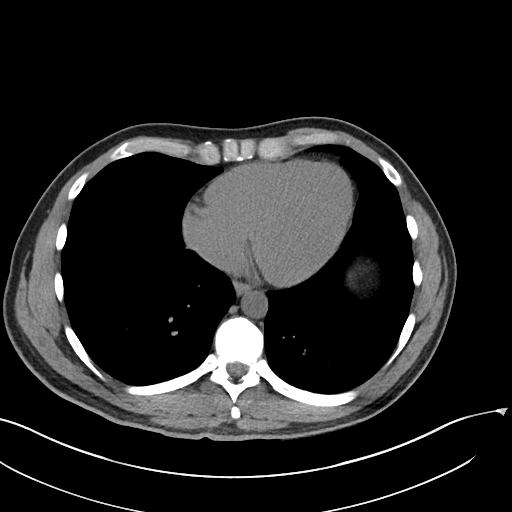

[Series 4: renal stone 3.0 cor · coronal · 0.76mm/px · 3 of 81 slices shown]
[im 27/81  soft-tissue]
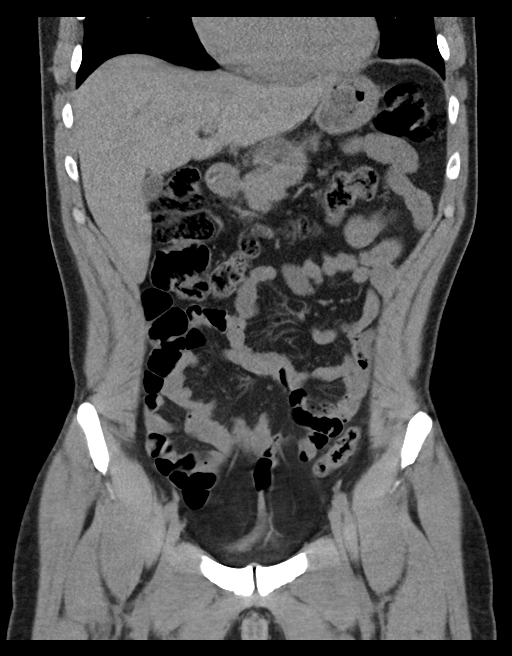
[im 36/81  soft-tissue]
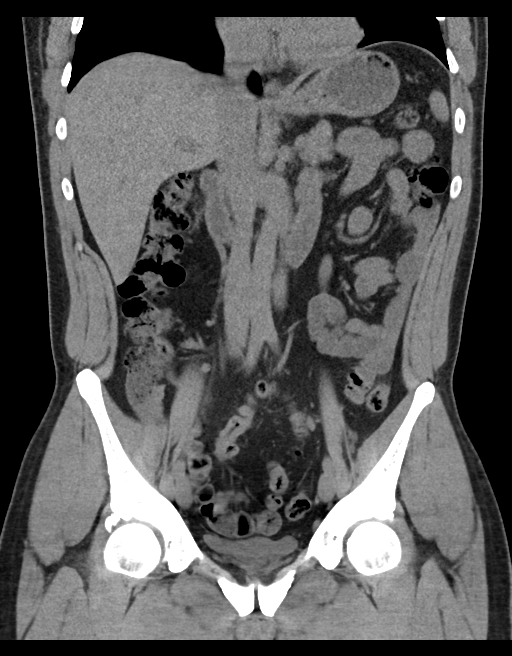
[im 45/81  soft-tissue]
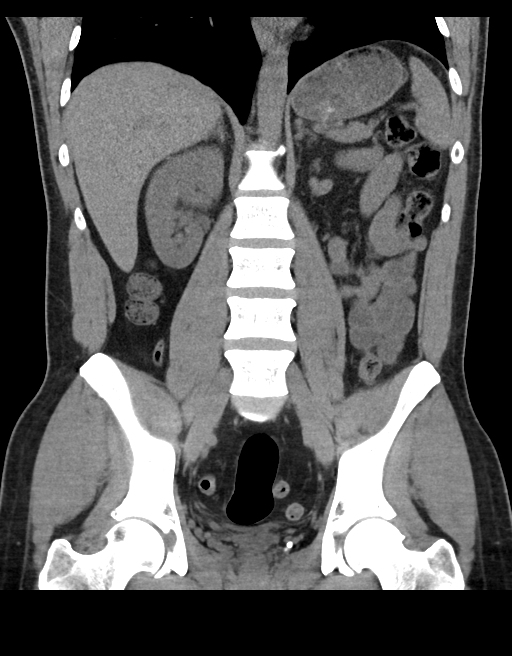

[16 of 46 positions shown; findings below may reference images not displayed]

FINDINGS: Lower chest: Lung bases are clear.  No pleural effusions.

Hepatobiliary: Normal appearance of the gallbladder. No biliary
dilatation. Normal appearance of the liver.

Pancreas: Normal appearance of the pancreas without inflammation or
duct dilatation.

Spleen: Normal appearance of spleen without enlargement.

Adrenals/Urinary Tract: Normal adrenal glands. Normal appearance the
left kidney without stones or hydronephrosis. 3 mm stone in the
bladder and probably at the right ureterovesical junction with mild
fullness of the right ureter. Slightly decreased attenuation in the
right kidney compared to the left. This decreased attenuation in
right kidney is suggestive for edema. 2 mm stone in the right kidney
lower pole.

Stomach/Bowel: Stomach is within normal limits. Appendix appears
normal. No evidence of bowel wall thickening, distention, or
inflammatory changes.

Vascular/Lymphatic: Duplicated inferior vena cava. The left IVC
drains into the left renal vein which is the expected configuration.
Otherwise, the vascular structures are unremarkable. No suspicious
lymphadenopathy in the abdomen or pelvis.

Reproductive: Few calcifications in the prostate. Normal appearance
of the seminal vesicles.

Other: No free fluid.  No free air.

Musculoskeletal: No acute bone abnormality.
IMPRESSION: Right kidney is mildly edematous with minimal hydroureteronephrosis.
Findings are secondary to a 3 mm stone along the posterior bladder
and probably within the right ureterovesical junction.

Small right renal calculus.

Duplicated IVC.
